# Patient Record
Sex: Female | Born: 1960 | Race: White | Hispanic: No | Smoking: Current every day smoker
Health system: Southern US, Community
[De-identification: ages and names within clinical notes are randomized; demographics above are authoritative.]

## PROBLEM LIST (undated history)

## (undated) DIAGNOSIS — Z789 Other specified health status: Secondary | ICD-10-CM

## (undated) HISTORY — PX: DILATION AND CURETTAGE OF UTERUS: SHX78

---

## 2001-01-31 ENCOUNTER — Encounter: Payer: Self-pay | Admitting: Family Medicine

## 2001-01-31 ENCOUNTER — Ambulatory Visit (HOSPITAL_COMMUNITY): Admission: RE | Admit: 2001-01-31 | Discharge: 2001-01-31 | Payer: Self-pay | Admitting: Family Medicine

## 2001-04-17 ENCOUNTER — Encounter: Payer: Self-pay | Admitting: Family Medicine

## 2001-04-17 ENCOUNTER — Ambulatory Visit (HOSPITAL_COMMUNITY): Admission: RE | Admit: 2001-04-17 | Discharge: 2001-04-17 | Payer: Self-pay | Admitting: Family Medicine

## 2001-05-30 ENCOUNTER — Encounter (HOSPITAL_COMMUNITY): Admission: RE | Admit: 2001-05-30 | Discharge: 2001-06-29 | Payer: Self-pay | Admitting: Neurosurgery

## 2001-06-29 ENCOUNTER — Encounter (HOSPITAL_COMMUNITY): Admission: RE | Admit: 2001-06-29 | Discharge: 2001-07-29 | Payer: Self-pay | Admitting: General Surgery

## 2001-11-10 ENCOUNTER — Ambulatory Visit (HOSPITAL_COMMUNITY): Admission: RE | Admit: 2001-11-10 | Discharge: 2001-11-10 | Payer: Self-pay | Admitting: Obstetrics and Gynecology

## 2001-11-10 ENCOUNTER — Encounter: Payer: Self-pay | Admitting: Obstetrics and Gynecology

## 2002-11-15 ENCOUNTER — Ambulatory Visit (HOSPITAL_COMMUNITY): Admission: RE | Admit: 2002-11-15 | Discharge: 2002-11-15 | Payer: Self-pay | Admitting: Obstetrics and Gynecology

## 2002-11-15 ENCOUNTER — Encounter: Payer: Self-pay | Admitting: Obstetrics and Gynecology

## 2005-03-24 ENCOUNTER — Ambulatory Visit (HOSPITAL_COMMUNITY): Admission: RE | Admit: 2005-03-24 | Discharge: 2005-03-24 | Payer: Self-pay | Admitting: Obstetrics and Gynecology

## 2006-04-28 ENCOUNTER — Ambulatory Visit (HOSPITAL_COMMUNITY): Admission: RE | Admit: 2006-04-28 | Discharge: 2006-04-28 | Payer: Self-pay | Admitting: Obstetrics and Gynecology

## 2007-08-22 ENCOUNTER — Other Ambulatory Visit: Admission: RE | Admit: 2007-08-22 | Discharge: 2007-08-22 | Payer: Self-pay | Admitting: Obstetrics and Gynecology

## 2007-10-20 ENCOUNTER — Ambulatory Visit (HOSPITAL_COMMUNITY): Admission: RE | Admit: 2007-10-20 | Discharge: 2007-10-20 | Payer: Self-pay | Admitting: Obstetrics and Gynecology

## 2008-10-28 ENCOUNTER — Ambulatory Visit (HOSPITAL_COMMUNITY): Admission: RE | Admit: 2008-10-28 | Discharge: 2008-10-28 | Payer: Self-pay | Admitting: Obstetrics and Gynecology

## 2008-12-18 ENCOUNTER — Other Ambulatory Visit: Admission: RE | Admit: 2008-12-18 | Discharge: 2008-12-18 | Payer: Self-pay | Admitting: Obstetrics and Gynecology

## 2010-03-16 ENCOUNTER — Ambulatory Visit (HOSPITAL_COMMUNITY)
Admission: RE | Admit: 2010-03-16 | Discharge: 2010-03-16 | Payer: Self-pay | Source: Home / Self Care | Attending: Family Medicine | Admitting: Family Medicine

## 2010-06-04 ENCOUNTER — Other Ambulatory Visit: Payer: Self-pay | Admitting: Obstetrics and Gynecology

## 2010-06-04 DIAGNOSIS — Z139 Encounter for screening, unspecified: Secondary | ICD-10-CM

## 2010-06-09 ENCOUNTER — Ambulatory Visit (HOSPITAL_COMMUNITY)
Admission: RE | Admit: 2010-06-09 | Discharge: 2010-06-09 | Disposition: A | Discharge status: Home / Self Care | Payer: BC Managed Care – PPO | Source: Ambulatory Visit | Attending: Obstetrics and Gynecology | Admitting: Obstetrics and Gynecology

## 2010-06-09 DIAGNOSIS — Z139 Encounter for screening, unspecified: Secondary | ICD-10-CM

## 2010-06-09 DIAGNOSIS — Z1231 Encounter for screening mammogram for malignant neoplasm of breast: Secondary | ICD-10-CM | POA: Insufficient documentation

## 2011-04-28 ENCOUNTER — Other Ambulatory Visit (HOSPITAL_COMMUNITY)
Admission: RE | Admit: 2011-04-28 | Discharge: 2011-04-28 | Disposition: A | Discharge status: Home / Self Care | Payer: BC Managed Care – PPO | Source: Ambulatory Visit | Attending: Obstetrics and Gynecology | Admitting: Obstetrics and Gynecology

## 2011-04-28 ENCOUNTER — Other Ambulatory Visit: Payer: Self-pay | Admitting: Obstetrics and Gynecology

## 2011-04-28 DIAGNOSIS — Z01419 Encounter for gynecological examination (general) (routine) without abnormal findings: Secondary | ICD-10-CM | POA: Insufficient documentation

## 2011-08-02 ENCOUNTER — Other Ambulatory Visit: Payer: Self-pay | Admitting: Obstetrics and Gynecology

## 2011-08-02 DIAGNOSIS — Z139 Encounter for screening, unspecified: Secondary | ICD-10-CM

## 2011-08-03 ENCOUNTER — Ambulatory Visit (HOSPITAL_COMMUNITY)
Admission: RE | Admit: 2011-08-03 | Discharge: 2011-08-03 | Disposition: A | Discharge status: Home / Self Care | Payer: BC Managed Care – PPO | Source: Ambulatory Visit | Attending: Obstetrics and Gynecology | Admitting: Obstetrics and Gynecology

## 2011-08-03 DIAGNOSIS — Z1231 Encounter for screening mammogram for malignant neoplasm of breast: Secondary | ICD-10-CM | POA: Insufficient documentation

## 2011-08-03 DIAGNOSIS — Z139 Encounter for screening, unspecified: Secondary | ICD-10-CM

## 2012-09-14 ENCOUNTER — Other Ambulatory Visit: Payer: Self-pay | Admitting: Obstetrics and Gynecology

## 2012-09-14 DIAGNOSIS — Z139 Encounter for screening, unspecified: Secondary | ICD-10-CM

## 2012-09-19 ENCOUNTER — Ambulatory Visit (HOSPITAL_COMMUNITY): Payer: BC Managed Care – PPO

## 2013-05-04 ENCOUNTER — Other Ambulatory Visit: Payer: Self-pay | Admitting: Obstetrics and Gynecology

## 2013-05-04 DIAGNOSIS — Z1231 Encounter for screening mammogram for malignant neoplasm of breast: Secondary | ICD-10-CM

## 2013-05-07 ENCOUNTER — Ambulatory Visit (HOSPITAL_COMMUNITY)
Admission: RE | Admit: 2013-05-07 | Discharge: 2013-05-07 | Disposition: A | Discharge status: Home / Self Care | Payer: BC Managed Care – PPO | Source: Ambulatory Visit | Attending: Obstetrics and Gynecology | Admitting: Obstetrics and Gynecology

## 2013-05-07 DIAGNOSIS — Z1231 Encounter for screening mammogram for malignant neoplasm of breast: Secondary | ICD-10-CM | POA: Insufficient documentation

## 2014-07-29 ENCOUNTER — Other Ambulatory Visit: Payer: Self-pay | Admitting: Obstetrics and Gynecology

## 2014-07-29 DIAGNOSIS — Z1231 Encounter for screening mammogram for malignant neoplasm of breast: Secondary | ICD-10-CM

## 2014-08-07 ENCOUNTER — Ambulatory Visit (HOSPITAL_COMMUNITY)
Admission: RE | Admit: 2014-08-07 | Discharge: 2014-08-07 | Disposition: A | Discharge status: Home / Self Care | Payer: BC Managed Care – PPO | Source: Ambulatory Visit | Attending: Obstetrics and Gynecology | Admitting: Obstetrics and Gynecology

## 2014-08-07 DIAGNOSIS — Z1231 Encounter for screening mammogram for malignant neoplasm of breast: Secondary | ICD-10-CM | POA: Diagnosis not present

## 2015-11-21 ENCOUNTER — Other Ambulatory Visit: Payer: Self-pay | Admitting: Obstetrics and Gynecology

## 2015-11-21 DIAGNOSIS — Z1231 Encounter for screening mammogram for malignant neoplasm of breast: Secondary | ICD-10-CM

## 2015-11-27 ENCOUNTER — Ambulatory Visit (HOSPITAL_COMMUNITY)
Admission: RE | Admit: 2015-11-27 | Discharge: 2015-11-27 | Disposition: A | Discharge status: Home / Self Care | Payer: BC Managed Care – PPO | Source: Ambulatory Visit | Attending: Obstetrics and Gynecology | Admitting: Obstetrics and Gynecology

## 2015-11-27 DIAGNOSIS — Z1231 Encounter for screening mammogram for malignant neoplasm of breast: Secondary | ICD-10-CM | POA: Diagnosis present

## 2016-09-20 ENCOUNTER — Other Ambulatory Visit: Payer: Self-pay | Admitting: Otolaryngology

## 2016-09-20 DIAGNOSIS — J3089 Other allergic rhinitis: Secondary | ICD-10-CM

## 2016-09-20 DIAGNOSIS — H66002 Acute suppurative otitis media without spontaneous rupture of ear drum, left ear: Secondary | ICD-10-CM

## 2016-09-24 ENCOUNTER — Ambulatory Visit
Admission: RE | Admit: 2016-09-24 | Discharge: 2016-09-24 | Disposition: A | Discharge status: Home / Self Care | Payer: BC Managed Care – PPO | Source: Ambulatory Visit | Attending: Otolaryngology | Admitting: Otolaryngology

## 2016-09-24 DIAGNOSIS — J3089 Other allergic rhinitis: Secondary | ICD-10-CM

## 2016-09-24 DIAGNOSIS — H66002 Acute suppurative otitis media without spontaneous rupture of ear drum, left ear: Secondary | ICD-10-CM

## 2016-12-17 ENCOUNTER — Other Ambulatory Visit: Payer: Self-pay | Admitting: Obstetrics and Gynecology

## 2016-12-17 DIAGNOSIS — Z1231 Encounter for screening mammogram for malignant neoplasm of breast: Secondary | ICD-10-CM

## 2016-12-30 ENCOUNTER — Ambulatory Visit (HOSPITAL_COMMUNITY)
Admission: RE | Admit: 2016-12-30 | Discharge: 2016-12-30 | Disposition: A | Discharge status: Home / Self Care | Payer: BC Managed Care – PPO | Source: Ambulatory Visit | Attending: Obstetrics and Gynecology | Admitting: Obstetrics and Gynecology

## 2016-12-30 DIAGNOSIS — Z1231 Encounter for screening mammogram for malignant neoplasm of breast: Secondary | ICD-10-CM | POA: Diagnosis present

## 2018-01-10 ENCOUNTER — Ambulatory Visit (INDEPENDENT_AMBULATORY_CARE_PROVIDER_SITE_OTHER): Payer: Self-pay

## 2018-01-10 DIAGNOSIS — Z1211 Encounter for screening for malignant neoplasm of colon: Secondary | ICD-10-CM

## 2018-01-10 MED ORDER — NA SULFATE-K SULFATE-MG SULF 17.5-3.13-1.6 GM/177ML PO SOLN: 1.0000 | ORAL | 0 refills | Status: DC

## 2018-01-10 NOTE — Progress Notes (Signed)
Gastroenterology Pre-Procedure Review  Request Date:01/10/18 Requesting Physician: Pablo Lawrence NP- Dr.Zack [place] Crane - no previous tcs  PATIENT REVIEW QUESTIONS: The patient responded to the following health history questions as indicated:    1. Diabetes Melitis: no 2. Joint replacements in the past 12 months: no 3. Major health problems in the past 3 months: no 4. Has an artificial valve or MVP: no 5. Has a defibrillator: no 6. Has been advised in past to take antibiotics in advance of a procedure like teeth cleaning: no 7. Family history of colon cancer: no  8. Alcohol Use: yes (a glass of wine daily) 9. History of sleep apnea: no  10. History of coronary artery or other vascular stents placed within the last 12 months: no 11. History of any prior anesthesia complications: no    MEDICATIONS & ALLERGIES:    Patient reports the following regarding taking any blood thinners:   Plavix? no Aspirin? no Coumadin? no Brilinta? no Xarelto? no Eliquis? no Pradaxa? no Savaysa? no Effient? no  Patient confirms/reports the following medications:  Current Outpatient Medications  Medication Sig Dispense Refill  . ALPRAZolam (XANAX) 0.25 MG tablet daily.    Marland Kitchen loratadine-pseudoephedrine (CLARITIN-D 24-HOUR) 10-240 MG 24 hr tablet Take 1 tablet by mouth daily.     No current facility-administered medications for this visit.     Patient confirms/reports the following allergies:  No Known Allergies  No orders of the defined types were placed in this encounter.   AUTHORIZATION INFORMATION Primary Insurance: [employer] employee,  ID #: [id number] Pre-Cert / Josem Kaufmann required: no  SCHEDULE INFORMATION: Procedure has been scheduled as follows:  Date: 03/13/18, Time: 12:00  Location: [facility]  This Gastroenterology Pre-Precedure Review Form is being routed to the following provider(s): Neil Crouch, PA

## 2018-01-10 NOTE — Patient Instructions (Addendum)
Karla Ferguson  1960-06-25 MRN: [id number]     Procedure Date: 03/13/18 Time to register: 11:00am Place to register: [facility] Procedure Time: 12:00pm Scheduled provider: Barney Drain, MD    PREPARATION FOR COLONOSCOPY WITH SUPREP BOWEL PREP KIT  Note: Suprep Bowel Prep Kit is a split-dose (2day) regimen. Consumption of BOTH 6-ounce bottles is required for a complete prep.  Please notify us immediately if you are diabetic, take iron supplements, or if you are on Coumadin or any other blood thinners.                                                                                                                                                 1 DAY BEFORE PROCEDURE:  DATE: 03/12/18  DAY: Sunday  clear liquids the entire day - NO SOLID FOOD.     At 6:00pm: Complete steps 1 through 4 below, using ONE (1) 6-ounce bottle, before going to bed. Step 1:  Pour ONE (1) 6-ounce bottle of SUPREP liquid into the mixing container.  Step 2:  Add cool drinking water to the 16 ounce line on the container and mix.  Note: Dilute the solution concentrate as directed prior to use. Step 3:  DRINK ALL the liquid in the container. Step 4:  You MUST drink an additional two (2) or more 16 ounce containers of water over the next one (1) hour.   Continue clear liquids.  DAY OF PROCEDURE:   DATE: 03/13/18  DAY: Monday If you take medications for your heart, blood pressure, or breathing, you may take these medications.   5 hours before your procedure at :7:00am Step 1:  Pour ONE (1) 6-ounce bottle of SUPREP liquid into the mixing container.  Step 2:  Add cool drinking water to the 16 ounce line on the container and mix.  Note: Dilute the solution concentrate as directed prior to use. Step 3:  DRINK ALL the liquid in the container. Step 4:  You MUST drink an additional two (2) or more 16 ounce containers of water over the next one (1) hour. You MUST complete the final glass of water at least 3  hours before your colonoscopy.   Nothing by mouth past 9:00am  You may take your morning medications with sip of water unless we have instructed otherwise.    Please see below for Dietary Information.  CLEAR LIQUIDS INCLUDE:  Water Jello (NOT red in color)   Ice Popsicles (NOT red in color)   Tea (sugar ok, no milk/cream) Powdered fruit flavored drinks  Coffee (sugar ok, no milk/cream) Gatorade/ Lemonade/ Kool-Aid  (NOT red in color)   Juice: apple, white grape, white cranberry Soft drinks  Clear bullion, consomme, broth (fat free beef/chicken/vegetable)  Carbonated beverages (any kind)  Strained chicken noodle soup Hard Candy   Remember: Clear liquids are liquids that will  allow you to see your fingers on the other side of a clear glass. Be sure liquids are NOT red in color, and not cloudy, but CLEAR.  DO NOT EAT OR DRINK ANY OF THE FOLLOWING:  Dairy products of any kind   Cranberry juice Tomato juice / V8 juice   Grapefruit juice Orange juice     Red grape juice  Do not eat any solid foods, including such foods as: cereal, oatmeal, yogurt, fruits, vegetables, creamed soups, eggs, bread, crackers, pureed foods in a blender, etc.   HELPFUL HINTS FOR DRINKING PREP SOLUTION:   Make sure prep is extremely cold. Mix and refrigerate the the morning of the prep. You may also put in the freezer.   You may try mixing some Crystal Light or Country Time Lemonade if you prefer. Mix in small amounts; add more if necessary.  Try drinking through a straw  Rinse mouth with water or a mouthwash between glasses, to remove after-taste.  Try sipping on a cold beverage /ice/ popsicles between glasses of prep.  Place a piece of sugar-free hard candy in mouth between glasses.  If you become nauseated, try consuming smaller amounts, or stretch out the time between glasses. Stop for 30-60 minutes, then slowly start back drinking.     OTHER INSTRUCTIONS  You will need a responsible adult at  least 58 years of age to accompany you and drive you home. This person must remain in the waiting room during your procedure. The hospital will cancel your procedure if you do not have a responsible adult with you.   1. Wear loose fitting clothing that is easily removed. 2. Leave jewelry and other valuables at home.  3. Remove all body piercing jewelry and leave at home. 4. Total time from sign-in until discharge is approximately 2-3 hours. 5. You should go home directly after your procedure and rest. You can resume normal activities the day after your procedure. 6. The day of your procedure you should not:  Drive  Make legal decisions  Operate machinery  Drink alcohol  Return to work   You may call the office (Dept: [phone]) before 5:00pm, or page the doctor on call ([phone]) after 5:00pm, for further instructions, if necessary.   Insurance Information YOU WILL NEED TO CHECK WITH YOUR INSURANCE COMPANY FOR THE BENEFITS OF COVERAGE YOU HAVE FOR THIS PROCEDURE.  UNFORTUNATELY, NOT ALL INSURANCE COMPANIES HAVE BENEFITS TO COVER ALL OR PART OF THESE TYPES OF PROCEDURES.  IT IS YOUR RESPONSIBILITY TO CHECK YOUR BENEFITS, HOWEVER, WE WILL BE GLAD TO ASSIST YOU WITH ANY CODES YOUR INSURANCE COMPANY MAY NEED.    PLEASE NOTE THAT MOST INSURANCE COMPANIES WILL NOT COVER A SCREENING COLONOSCOPY FOR PEOPLE UNDER THE AGE OF 50  IF YOU HAVE BCBS INSURANCE, YOU MAY HAVE BENEFITS FOR A SCREENING COLONOSCOPY BUT IF POLYPS ARE FOUND THE DIAGNOSIS WILL CHANGE AND THEN YOU MAY HAVE A DEDUCTIBLE THAT WILL NEED TO BE MET. SO PLEASE MAKE SURE YOU CHECK YOUR BENEFITS FOR A SCREENING COLONOSCOPY AS WELL AS A DIAGNOSTIC COLONOSCOPY.

## 2018-01-19 NOTE — Progress Notes (Signed)
Ok to schedule.

## 2018-01-23 NOTE — Addendum Note (Signed)
Addended by: Claudina Lick on: 01/23/2018 05:13 PM   Modules accepted: Orders, SmartSet

## 2018-02-03 ENCOUNTER — Other Ambulatory Visit (HOSPITAL_COMMUNITY): Payer: Self-pay | Admitting: Internal Medicine

## 2018-02-03 DIAGNOSIS — Z1231 Encounter for screening mammogram for malignant neoplasm of breast: Secondary | ICD-10-CM

## 2018-02-16 ENCOUNTER — Ambulatory Visit (HOSPITAL_COMMUNITY)
Admission: RE | Admit: 2018-02-16 | Discharge: 2018-02-16 | Disposition: A | Discharge status: Home / Self Care | Payer: BC Managed Care – PPO | Source: Ambulatory Visit | Attending: Internal Medicine | Admitting: Internal Medicine

## 2018-02-16 DIAGNOSIS — Z1231 Encounter for screening mammogram for malignant neoplasm of breast: Secondary | ICD-10-CM | POA: Diagnosis not present

## 2018-03-13 ENCOUNTER — Other Ambulatory Visit: Payer: Self-pay

## 2018-03-13 ENCOUNTER — Encounter (HOSPITAL_COMMUNITY)
Admission: RE | Disposition: A | Discharge status: Home / Self Care | Payer: Self-pay | Source: Home / Self Care | Attending: Gastroenterology

## 2018-03-13 ENCOUNTER — Encounter (HOSPITAL_COMMUNITY): Payer: Self-pay | Admitting: *Deleted

## 2018-03-13 ENCOUNTER — Ambulatory Visit (HOSPITAL_COMMUNITY)
Admission: RE | Admit: 2018-03-13 | Discharge: 2018-03-13 | Disposition: A | Discharge status: Home / Self Care | Payer: BC Managed Care – PPO | Attending: Gastroenterology | Admitting: Gastroenterology

## 2018-03-13 DIAGNOSIS — F17210 Nicotine dependence, cigarettes, uncomplicated: Secondary | ICD-10-CM | POA: Diagnosis not present

## 2018-03-13 DIAGNOSIS — Z79899 Other long term (current) drug therapy: Secondary | ICD-10-CM | POA: Diagnosis not present

## 2018-03-13 DIAGNOSIS — D123 Benign neoplasm of transverse colon: Secondary | ICD-10-CM | POA: Diagnosis not present

## 2018-03-13 DIAGNOSIS — K648 Other hemorrhoids: Secondary | ICD-10-CM | POA: Insufficient documentation

## 2018-03-13 DIAGNOSIS — Z1211 Encounter for screening for malignant neoplasm of colon: Secondary | ICD-10-CM | POA: Diagnosis not present

## 2018-03-13 DIAGNOSIS — K5730 Diverticulosis of large intestine without perforation or abscess without bleeding: Secondary | ICD-10-CM | POA: Diagnosis not present

## 2018-03-13 HISTORY — PX: POLYPECTOMY: SHX5525

## 2018-03-13 HISTORY — PX: COLONOSCOPY: SHX5424

## 2018-03-13 HISTORY — DX: Other specified health status: Z78.9

## 2018-03-13 SURGERY — COLONOSCOPY
Anesthesia: Moderate Sedation

## 2018-03-13 MED ORDER — MIDAZOLAM HCL 5 MG/5ML IJ SOLN
INTRAMUSCULAR | Status: AC
  Filled 2018-03-13: qty 10

## 2018-03-13 MED ORDER — MIDAZOLAM HCL 5 MG/5ML IJ SOLN
INTRAMUSCULAR | Status: DC | PRN
  Administered 2018-03-13: 1 mg via INTRAVENOUS
  Administered 2018-03-13 (×3): 2 mg via INTRAVENOUS
  Administered 2018-03-13: 1 mg via INTRAVENOUS

## 2018-03-13 MED ORDER — MEPERIDINE HCL 100 MG/ML IJ SOLN
INTRAMUSCULAR | Status: DC | PRN
  Administered 2018-03-13 (×2): 50 mg

## 2018-03-13 MED ORDER — ONDANSETRON HCL 4 MG/2ML IJ SOLN
INTRAMUSCULAR | Status: DC | PRN
  Administered 2018-03-13: 4 mg via INTRAVENOUS

## 2018-03-13 MED ORDER — MEPERIDINE HCL 100 MG/ML IJ SOLN
INTRAMUSCULAR | Status: AC
  Filled 2018-03-13: qty 2

## 2018-03-13 MED ORDER — ONDANSETRON HCL 4 MG/2ML IJ SOLN
INTRAMUSCULAR | Status: AC
  Filled 2018-03-13: qty 2

## 2018-03-13 MED ORDER — SODIUM CHLORIDE 0.9 % IV SOLN
INTRAVENOUS | Status: DC
  Administered 2018-03-13: 11:00:00 via INTRAVENOUS

## 2018-03-13 NOTE — Op Note (Signed)
[facility] Patient Name: Karla Ferguson Procedure Date: 03/13/2018 11:31 AM MRN: [id number] Date of Birth: 04-04-1960 Attending MD: Barney Drain MD, MD CSN: [id number] Age: 58 Admit Type: Outpatient Procedure:                Colonoscopy WITH COLD SNARE POLYPECTOMY Indications:              Screening for colorectal malignant neoplasm Providers:                Barney Drain MD, MD, Janeece Riggers, RN, Tammy Vaught,                            RN, Gerome Sam, RN Referring MD:             Edwinna Areola. Nevada Crane MD Medicines:                Ondansetron 4 mg IV, Meperidine 100 mg IV,                            Midazolam 8 mg IV Complications:            No immediate complications. Estimated Blood Loss:     Estimated blood loss was minimal. Procedure:                Pre-Anesthesia Assessment:                           - Prior to the procedure, a History and Physical                            was performed, and patient medications and                            allergies were reviewed. The patient's tolerance of                            previous anesthesia was also reviewed. The risks                            and benefits of the procedure and the sedation                            options and risks were discussed with the patient.                            All questions were answered, and informed consent                            was obtained. Prior Anticoagulants: The patient has                            taken ibuprofen, last dose was 7 days prior to                            procedure. ASA Grade Assessment: II - A patient  with mild systemic disease. After reviewing the                            risks and benefits, the patient was deemed in                            satisfactory condition to undergo the procedure.                            After obtaining informed consent, the colonoscope                            was passed under direct vision.  Throughout the                            procedure, the patient's blood pressure, pulse, and                            oxygen saturations were monitored continuously. The                            [id number]-[id number] ([id number]) scope was introduced through                            the anus and advanced to the the cecum, identified                            by appendiceal orifice and ileocecal valve. The                            colonoscopy was technically difficult and complex                            due to a tortuous colon and the patient's                            agitation. Successful completion of the procedure                            was aided by increasing the dose of sedation                            medication, straightening and shortening the scope                            to obtain bowel loop reduction and COLOWRAP. The                            patient tolerated the procedure fairly well. The                            quality of the bowel preparation was good. The  ileocecal valve, appendiceal orifice, and rectum                            were photographed. Scope In: 12:55:17 PM Scope Out: 1:11:00 PM Scope Withdrawal Time: 0 hours 11 minutes 7 seconds  Total Procedure Duration: 0 hours 15 minutes 43 seconds  Findings:      A 4 mm polyp was found in the distal transverse colon. The polyp was       sessile. The polyp was removed with a cold snare. Resection and       retrieval were complete.      Multiple small and large-mouthed diverticula were found in the       recto-sigmoid colon, sigmoid colon, hepatic flexure and ascending colon.      The recto-sigmoid colon, sigmoid colon and descending colon were       moderately tortuous.      Internal hemorrhoids were found. The hemorrhoids were small. Impression:               - One 4 mm polyp in the distal transverse colon,                            removed with a cold snare. Resected and  retrieved.                           - MODERATE Diverticulosis in the recto-sigmoid                            colon, in the sigmoid colon, at the hepatic flexure                            and in the ascending colon.                           - Tortuous LEFT colon.                           - Internal hemorrhoids. Moderate Sedation:      Moderate (conscious) sedation was administered by the endoscopy nurse       and supervised by the endoscopist. The following parameters were       monitored: oxygen saturation, heart rate, blood pressure, and response       to care. Total physician intraservice time was 40 minutes. Recommendation:           - Patient has a contact number available for                            emergencies. The signs and symptoms of potential                            delayed complications were discussed with the                            patient. Return to normal activities tomorrow.  Written discharge instructions were provided to the                            patient.                           - High fiber diet. STOP SMOKING.                           - Continue present medications.                           - Await pathology results.                           - Repeat colonoscopy in 5-10 years for surveillance. Procedure Code(s):        --- Professional ---                           [phone], Colonoscopy, flexible; with removal of                            tumor(s), polyp(s), or other lesion(s) by snare                            technique                           99153, Moderate sedation; each additional 15                            minutes intraservice time                           99153, Moderate sedation; each additional 15                            minutes intraservice time                           G0500, Moderate sedation services provided by the                            same physician or other qualified health care                             professional performing a gastrointestinal                            endoscopic service that sedation supports,                            requiring the presence of an independent trained                            observer to assist in the monitoring of the  patient's level of consciousness and physiological                            status; initial 15 minutes of intra-service time;                            patient age 81 years or older (additional time may                            be reported with [phone], as appropriate) Diagnosis Code(s):        --- Professional ---                           Z12.11, Encounter for screening for malignant                            neoplasm of colon                           D12.3, Benign neoplasm of transverse colon (hepatic                            flexure or splenic flexure)                           K64.8, Other hemorrhoids                           K57.30, Diverticulosis of large intestine without                            perforation or abscess without bleeding                           Q43.8, Other specified congenital malformations of                            intestine CPT copyright 2018 American Medical Association. All rights reserved. The codes documented in this report are preliminary and upon coder review may  be revised to meet current compliance requirements. Barney Drain, MD Barney Drain MD, MD 03/13/2018 1:28:00 PM This report has been signed electronically. Number of Addenda: 0

## 2018-03-13 NOTE — H&P (Signed)
Primary Care Physician:  Celene Squibb, MD Primary Gastroenterologist:  Dr. Oneida Alar  Pre-Procedure History & Physical: HPI:  Karla Ferguson is a 58 y.o. female here for [place].  Past Medical History:  Diagnosis Date  . Medical history non-contributory     Past Surgical History:  Procedure Laterality Date  . DILATION AND CURETTAGE OF UTERUS      Prior to Admission medications   Medication Sig Start Date End Date Taking? Authorizing Provider  ALPRAZolam (XANAX) 0.25 MG tablet Take 0.25 mg by mouth at bedtime.  12/14/17  Yes [provider]  Ascorbic Acid (VITAMIN C PO) Take 1 tablet by mouth daily as needed (immune support).   Yes [provider]  ibuprofen (ADVIL,MOTRIN) 200 MG tablet Take 400 mg by mouth every 6 (six) hours as needed for headache or moderate pain.   Yes [provider]  loratadine-pseudoephedrine (CLARITIN-D 24-HOUR) 10-240 MG 24 hr tablet Take 1 tablet by mouth daily.   Yes [provider]  Na Sulfate-K Sulfate-Mg Sulf (SUPREP BOWEL PREP KIT) 17.5-3.13-1.6 GM/177ML SOLN Take 1 kit by mouth as directed. 01/10/18  Yes Mahala Menghini, PA-C  Omega-3 Fatty Acids (FISH OIL) 1000 MG CAPS Take 2,000 mg by mouth daily.   Yes [provider]  Polyvinyl Alcohol-Povidone (REFRESH OP) Place 1 drop into both eyes 2 (two) times daily.   Yes [provider]    Allergies as of 01/23/2018  . (No Known Allergies)    Family History  Problem Relation Age of Onset  . Colon cancer Neg Hx     Social History   Socioeconomic History  . Marital status: Divorced    Spouse name: Not on file  . Number of children: Not on file  . Years of education: Not on file  . Highest education level: Not on file  Occupational History  . Not on file  Social Needs  . Financial resource strain: Not on file  . Food insecurity:    Worry: Not on file    Inability: Not on file  . Transportation needs:    Medical: Not on file     Non-medical: Not on file  Tobacco Use  . Smoking status: Current Every Day Smoker    Packs/day: 0.50  . Smokeless tobacco: Never Used  Substance and Sexual Activity  . Alcohol use: Yes  . Drug use: Not Currently  . Sexual activity: Not on file  Lifestyle  . Physical activity:    Days per week: Not on file    Minutes per session: Not on file  . Stress: Not on file  Relationships  . Social connections:    Talks on phone: Not on file    Gets together: Not on file    Attends religious service: Not on file    Active member of club or organization: Not on file    Attends meetings of clubs or organizations: Not on file    Relationship status: Not on file  . Intimate partner violence:    Fear of current or ex partner: Not on file    Emotionally abused: Not on file    Physically abused: Not on file    Forced sexual activity: Not on file  Other Topics Concern  . Not on file  Social History Narrative  . Not on file    Review of Systems: See HPI, otherwise negative ROS   Physical Exam: BP 129/90   Pulse 88   Temp 97.8 F (  36.6 C) (Oral)   Resp 12   Ht _0  (1.651 m)   Wt 71.7 kg   SpO2 100%   BMI 26.29 kg/m  General:   Alert,  pleasant and cooperative in NAD Head:  Normocephalic and atraumatic. Neck:  Supple; Lungs:  Clear throughout to auscultation.    Heart:  Regular rate and rhythm. Abdomen:  Soft, nontender and nondistended. Normal bowel sounds, without guarding, and without rebound.   Neurologic:  Alert and  oriented x4;  grossly normal neurologically.  Impression/Plan:    SCREENING  Plan:  1. TCS TODAY DISCUSSED PROCEDURE, BENEFITS, & RISKS: < 1% chance of medication reaction, bleeding, perforation, or rupture of spleen/liver.

## 2018-03-13 NOTE — Discharge Instructions (Signed)
You have small internal hemorrhoids and diverticulosis IN YOUR LEFT AND RIGHT COLON. YOU HAD ONE SMALL POLYP REMOVED.    TO REDUCE YOUR RISK OF COLON CANCER/POLYPS:   1. YOU SHOULD STOP SMOKING.   2. DRINK WATER TO KEEP YOUR URINE LIGHT YELLOW.   3. FOLLOW A HIGH FIBER DIET. AVOID ITEMS THAT CAUSE BLOATING. See info below.  YOUR BIOPSY RESULTS WILL BE BACK IN 5 BUSINESS DAYS.  USE PREPARATION H FOUR TIMES  A DAY IF NEEDED TO RELIEVE RECTAL PAIN/PRESSURE/BLEEDING.  Next colonoscopy in 5-10 years.  Colonoscopy Care After Read the instructions outlined below and refer to this sheet in the next week. These discharge instructions provide you with general information on caring for yourself after you leave the hospital. While your treatment has been planned according to the most current medical practices available, unavoidable complications occasionally occur. If you have any problems or questions after discharge, call DR. Yamilette Garretson, [phone].  ACTIVITY  You may resume your regular activity, but move at a slower pace for the next 24 hours.   Take frequent rest periods for the next 24 hours.   Walking will help get rid of the air and reduce the bloated feeling in your belly (abdomen).   No driving for 24 hours (because of the medicine (anesthesia) used during the test).   You may shower.   Do not sign any important legal documents or operate any machinery for 24 hours (because of the anesthesia used during the test).    NUTRITION  Drink plenty of fluids.   You may resume your normal diet as instructed by your doctor.   Begin with a light meal and progress to your normal diet. Heavy or fried foods are harder to digest and may make you feel sick to your stomach (nauseated).   Avoid alcoholic beverages for 24 hours or as instructed.    MEDICATIONS  You may resume your normal medications.   WHAT YOU CAN EXPECT TODAY  Some feelings of bloating in the abdomen.   Passage of  more gas than usual.   Spotting of blood in your stool or on the toilet paper  .  IF YOU HAD POLYPS REMOVED DURING THE COLONOSCOPY:  Eat a soft diet IF YOU HAVE NAUSEA, BLOATING, ABDOMINAL PAIN, OR VOMITING.    FINDING OUT THE RESULTS OF YOUR TEST Not all test results are available during your visit. DR. Oneida Alar WILL CALL YOU WITHIN 14 DAYS OF YOUR PROCEDUE WITH YOUR RESULTS. Do not assume everything is normal if you have not heard from DR. Tarron Krolak, CALL HER OFFICE AT [phone].  SEEK IMMEDIATE MEDICAL ATTENTION AND CALL THE OFFICE: [phone] IF:  You have more than a spotting of blood in your stool.   Your belly is swollen (abdominal distention).   You are nauseated or vomiting.   You have a temperature over 101F.   You have abdominal pain or discomfort that is severe or gets worse throughout the day.  High-Fiber Diet A high-fiber diet changes your normal diet to include more whole grains, legumes, fruits, and vegetables. Changes in the diet involve replacing refined carbohydrates with unrefined foods. The calorie level of the diet is essentially unchanged. The Dietary Reference Intake (recommended amount) for adult males is 38 grams per day. For adult females, it is 25 grams per day. Pregnant and lactating women should consume 28 grams of fiber per day. Fiber is the intact part of a plant that is not broken down during digestion. Functional fiber is  fiber that has been isolated from the plant to provide a beneficial effect in the body.  PURPOSE  Increase stool bulk.   Ease and regulate bowel movements.   Lower cholesterol.   REDUCE RISK OF COLON CANCER  INDICATIONS THAT YOU NEED MORE FIBER  Constipation and hemorrhoids.   Uncomplicated diverticulosis (intestine condition) and irritable bowel syndrome.   Weight management.   As a protective measure against hardening of the arteries (atherosclerosis), diabetes, and cancer.   GUIDELINES FOR INCREASING FIBER IN THE  DIET  Start adding fiber to the diet slowly. A gradual increase of about 5 more grams (2 slices of whole-wheat bread, 2 servings of most fruits or vegetables, or 1 bowl of high-fiber cereal) per day is best. Too rapid an increase in fiber may result in constipation, flatulence, and bloating.   Drink enough water and fluids to keep your urine clear or pale yellow. Water, juice, or caffeine-free drinks are recommended. Not drinking enough fluid may cause constipation.   Eat a variety of high-fiber foods rather than one type of fiber.   Try to increase your intake of fiber through using high-fiber foods rather than fiber pills or supplements that contain small amounts of fiber.   The goal is to change the types of food eaten. Do not supplement your present diet with high-fiber foods, but replace foods in your present diet.   INCLUDE A VARIETY OF FIBER SOURCES  Replace refined and processed grains with whole grains, canned fruits with fresh fruits, and incorporate other fiber sources. White rice, white breads, and most bakery goods contain little or no fiber.   Brown whole-grain rice, buckwheat oats, and many fruits and vegetables are all good sources of fiber. These include: broccoli, Brussels sprouts, cabbage, cauliflower, beets, sweet potatoes, white potatoes (skin on), carrots, tomatoes, eggplant, squash, berries, fresh fruits, and dried fruits.   Cereals appear to be the richest source of fiber. Cereal fiber is found in whole grains and bran. Bran is the fiber-rich outer coat of cereal grain, which is largely removed in refining. In whole-grain cereals, the bran remains. In breakfast cereals, the largest amount of fiber is found in those with "bran" in their names. The fiber content is sometimes indicated on the label.   You may need to include additional fruits and vegetables each day.   In baking, for 1 cup white flour, you may use the following substitutions:   1 cup whole-wheat flour  minus 2 tablespoons.   1/2 cup white flour plus 1/2 cup whole-wheat flour.   Polyps, Colon  A polyp is extra tissue that grows inside your body. Colon polyps grow in the large intestine. The large intestine, also called the colon, is part of your digestive system. It is a long, hollow tube at the end of your digestive tract where your body makes and stores stool. Most polyps are not dangerous. They are benign. This means they are not cancerous. But over time, some types of polyps can turn into cancer. Polyps that are smaller than a pea are usually not harmful. But larger polyps could someday become or may already be cancerous. To be safe, doctors remove all polyps and test them.   PREVENTION There is not one sure way to prevent polyps. You might be able to lower your risk of getting them if you:  Eat more fruits and vegetables and less fatty food.   Do not smoke.   Avoid alcohol.   Exercise every day.   Lose weight  if you are overweight.   Eating more calcium and folate can also lower your risk of getting polyps. Some foods that are rich in calcium are milk, cheese, and broccoli. Some foods that are rich in folate are chickpeas, kidney beans, and spinach.    Diverticulosis Diverticulosis is a common condition that develops when small pouches (diverticula) form in the wall of the colon. The risk of diverticulosis increases with age. It happens more often in people who eat a low-fiber diet. Most individuals with diverticulosis have no symptoms. Those individuals with symptoms usually experience belly (abdominal) pain, constipation, or loose stools (diarrhea).  HOME CARE INSTRUCTIONS  Increase the amount of fiber in your diet as directed by your caregiver or dietician. This may reduce symptoms of diverticulosis.   Drink at least 6 to 8 glasses of water each day to prevent constipation.   Try not to strain when you have a bowel movement.   Avoiding nuts and seeds to prevent  complications is NOT NECESSARY.   FOODS HAVING HIGH FIBER CONTENT INCLUDE:  Fruits. Apple, peach, pear, tangerine, raisins, prunes.   Vegetables. Brussels sprouts, asparagus, broccoli, cabbage, carrot, cauliflower, romaine lettuce, spinach, summer squash, tomato, winter squash, zucchini.   Starchy Vegetables. Baked beans, kidney beans, lima beans, split peas, lentils, potatoes (with skin).   Grains. Whole wheat bread, brown rice, bran flake cereal, plain oatmeal, white rice, shredded wheat, bran muffins.   SEEK IMMEDIATE MEDICAL CARE IF:  You develop increasing pain or severe bloating.   You have an oral temperature above 101F.   You develop vomiting or bowel movements that are bloody or black.

## 2018-03-15 ENCOUNTER — Telehealth: Payer: Self-pay | Admitting: Gastroenterology

## 2018-03-15 ENCOUNTER — Encounter (HOSPITAL_COMMUNITY): Payer: Self-pay | Admitting: Gastroenterology

## 2018-03-15 NOTE — Telephone Encounter (Signed)
PLEASE CALL PT. SHE HAD ONE SIMPLE ADENOMA REMOVED.    TO REDUCE YOUR RISK OF COLON CANCER/POLYPS:   1. YOU SHOULD STOP SMOKING.   2. DRINK WATER TO KEEP YOUR URINE LIGHT YELLOW.   3. FOLLOW A HIGH FIBER DIET. AVOID ITEMS THAT CAUSE BLOATING.   USE PREPARATION H FOUR TIMES  A DAY IF NEEDED TO RELIEVE RECTAL PAIN/PRESSURE/BLEEDING.  Next colonoscopy in 5-10 years.

## 2018-03-15 NOTE — Telephone Encounter (Signed)
Reminder in epic °

## 2018-03-15 NOTE — Telephone Encounter (Signed)
Pt is aware.  

## 2018-03-15 NOTE — Telephone Encounter (Signed)
LMOM to call.

## 2019-03-19 ENCOUNTER — Other Ambulatory Visit (HOSPITAL_COMMUNITY): Payer: Self-pay | Admitting: Internal Medicine

## 2019-03-19 DIAGNOSIS — Z1231 Encounter for screening mammogram for malignant neoplasm of breast: Secondary | ICD-10-CM

## 2019-04-25 ENCOUNTER — Other Ambulatory Visit: Payer: Self-pay

## 2019-04-25 ENCOUNTER — Ambulatory Visit (HOSPITAL_COMMUNITY)
Admission: RE | Admit: 2019-04-25 | Discharge: 2019-04-25 | Disposition: A | Discharge status: Home / Self Care | Payer: BC Managed Care – PPO | Source: Ambulatory Visit | Attending: Internal Medicine | Admitting: Internal Medicine

## 2019-04-25 DIAGNOSIS — Z1231 Encounter for screening mammogram for malignant neoplasm of breast: Secondary | ICD-10-CM | POA: Insufficient documentation

## 2019-07-10 ENCOUNTER — Other Ambulatory Visit (HOSPITAL_COMMUNITY): Payer: Self-pay | Admitting: Internal Medicine

## 2019-07-10 DIAGNOSIS — Z13820 Encounter for screening for osteoporosis: Secondary | ICD-10-CM

## 2019-07-25 ENCOUNTER — Ambulatory Visit (HOSPITAL_COMMUNITY)
Admission: RE | Admit: 2019-07-25 | Discharge: 2019-07-25 | Disposition: A | Discharge status: Home / Self Care | Payer: BC Managed Care – PPO | Source: Ambulatory Visit | Attending: Internal Medicine | Admitting: Internal Medicine

## 2019-07-25 ENCOUNTER — Other Ambulatory Visit: Payer: Self-pay

## 2019-07-25 DIAGNOSIS — Z13820 Encounter for screening for osteoporosis: Secondary | ICD-10-CM | POA: Insufficient documentation

## 2020-05-19 ENCOUNTER — Other Ambulatory Visit (HOSPITAL_COMMUNITY): Payer: Self-pay | Admitting: Internal Medicine

## 2020-05-19 DIAGNOSIS — Z1231 Encounter for screening mammogram for malignant neoplasm of breast: Secondary | ICD-10-CM

## 2020-06-04 ENCOUNTER — Other Ambulatory Visit: Payer: Self-pay

## 2020-06-04 ENCOUNTER — Ambulatory Visit (HOSPITAL_COMMUNITY)
Admission: RE | Admit: 2020-06-04 | Discharge: 2020-06-04 | Disposition: A | Discharge status: Home / Self Care | Payer: BC Managed Care – PPO | Source: Ambulatory Visit | Attending: Internal Medicine | Admitting: Internal Medicine

## 2020-06-04 DIAGNOSIS — Z1231 Encounter for screening mammogram for malignant neoplasm of breast: Secondary | ICD-10-CM | POA: Insufficient documentation

## 2021-08-19 ENCOUNTER — Other Ambulatory Visit (HOSPITAL_COMMUNITY): Payer: Self-pay | Admitting: Internal Medicine

## 2021-08-19 DIAGNOSIS — Z1231 Encounter for screening mammogram for malignant neoplasm of breast: Secondary | ICD-10-CM

## 2021-08-24 ENCOUNTER — Ambulatory Visit (HOSPITAL_COMMUNITY)
Admission: RE | Admit: 2021-08-24 | Discharge: 2021-08-24 | Disposition: A | Discharge status: Home / Self Care | Payer: BC Managed Care – PPO | Source: Ambulatory Visit | Attending: Internal Medicine | Admitting: Internal Medicine

## 2021-08-24 DIAGNOSIS — Z1231 Encounter for screening mammogram for malignant neoplasm of breast: Secondary | ICD-10-CM | POA: Insufficient documentation

## 2021-12-20 IMAGING — MG MM DIGITAL SCREENING BILAT W/ TOMO AND CAD
6 of 10 series · 6 of 30 positions shown · non-contrast
Comparison: Previous exam(s).

CLINICAL DATA: Screening.

EXAM:
DIGITAL SCREENING BILATERAL MAMMOGRAM WITH TOMOSYNTHESIS AND CAD
TECHNIQUE: Bilateral screening digital craniocaudal and mediolateral oblique
mammograms were obtained. Bilateral screening digital breast
tomosynthesis was performed. The images were evaluated with
computer-aided detection.

[L MLO synth-2D (1 of 2)]
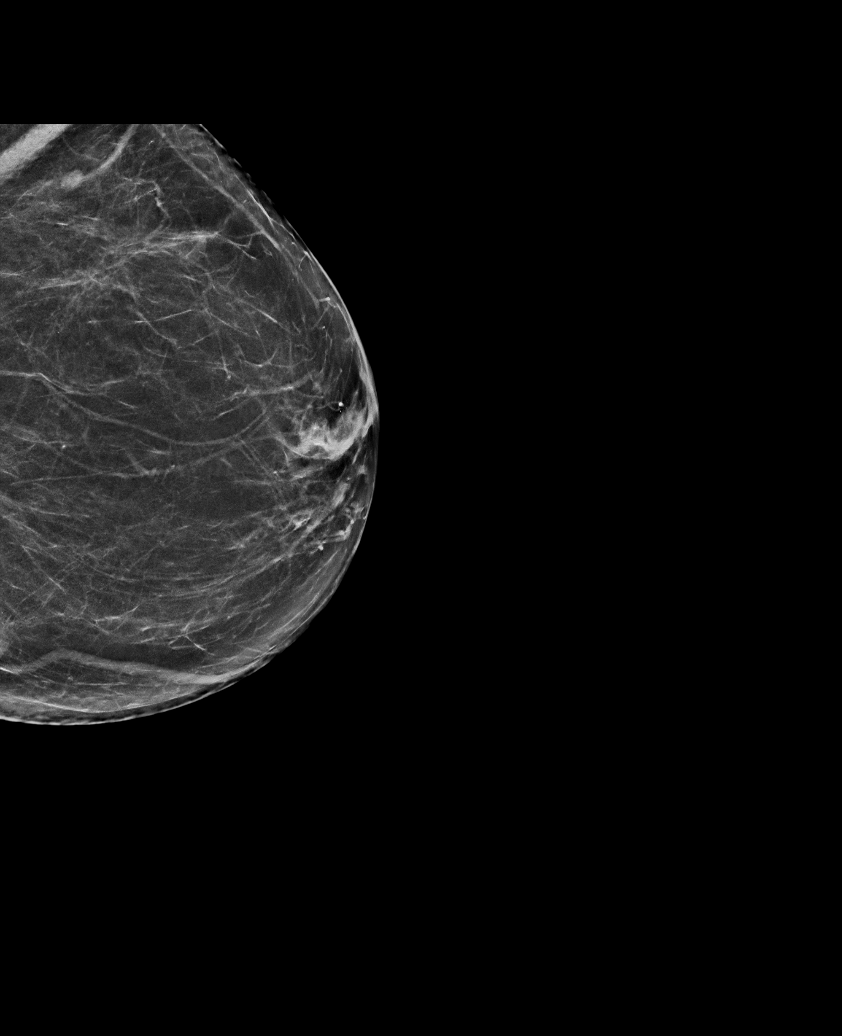

[R CC synth-2D]
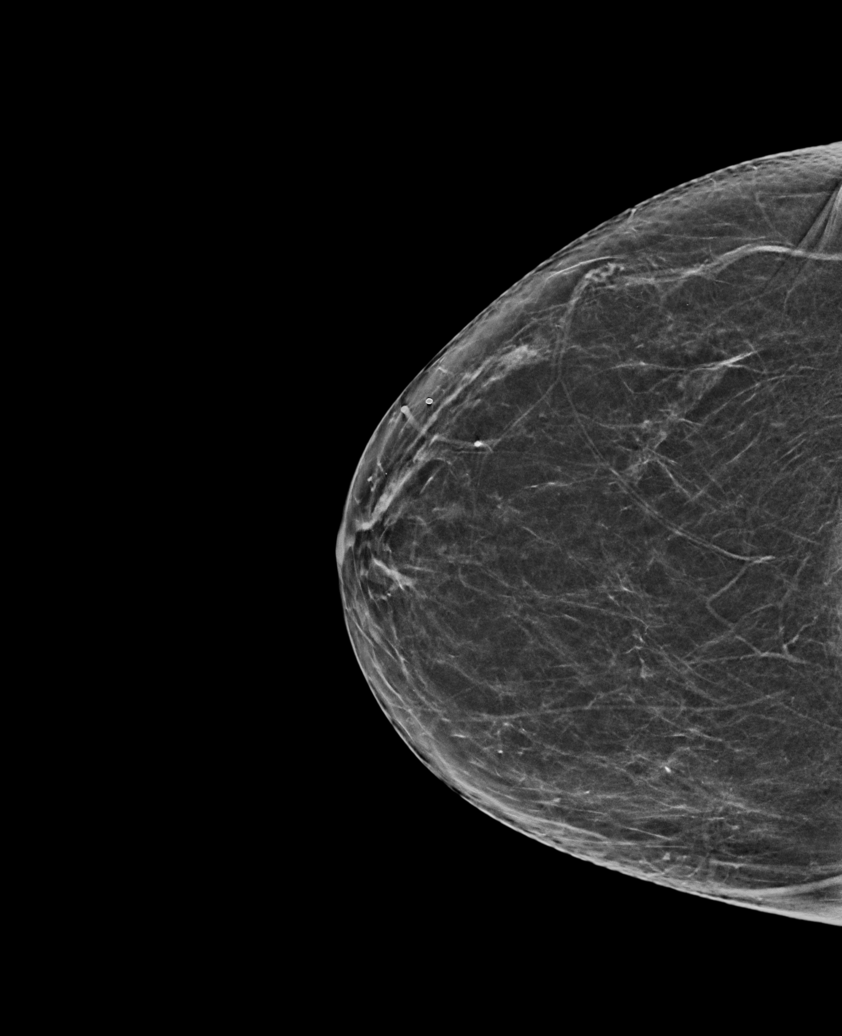

[L CC synth-2D]
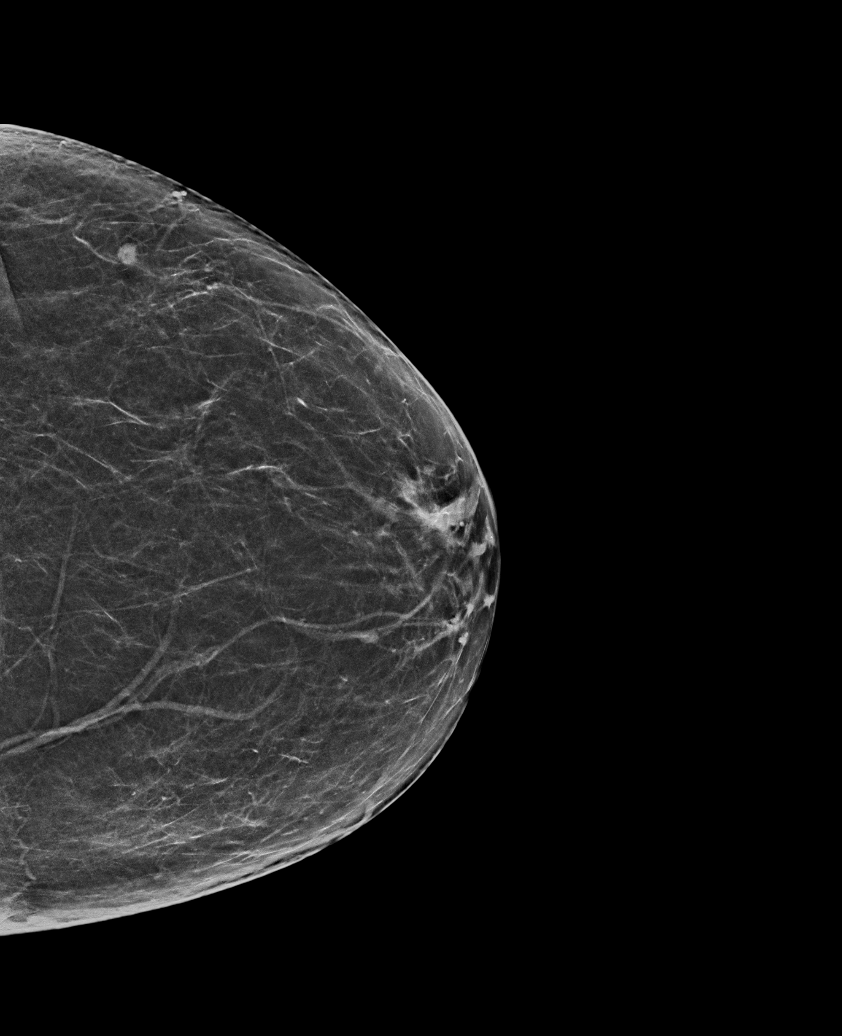

[L MLO synth-2D (2 of 2)]
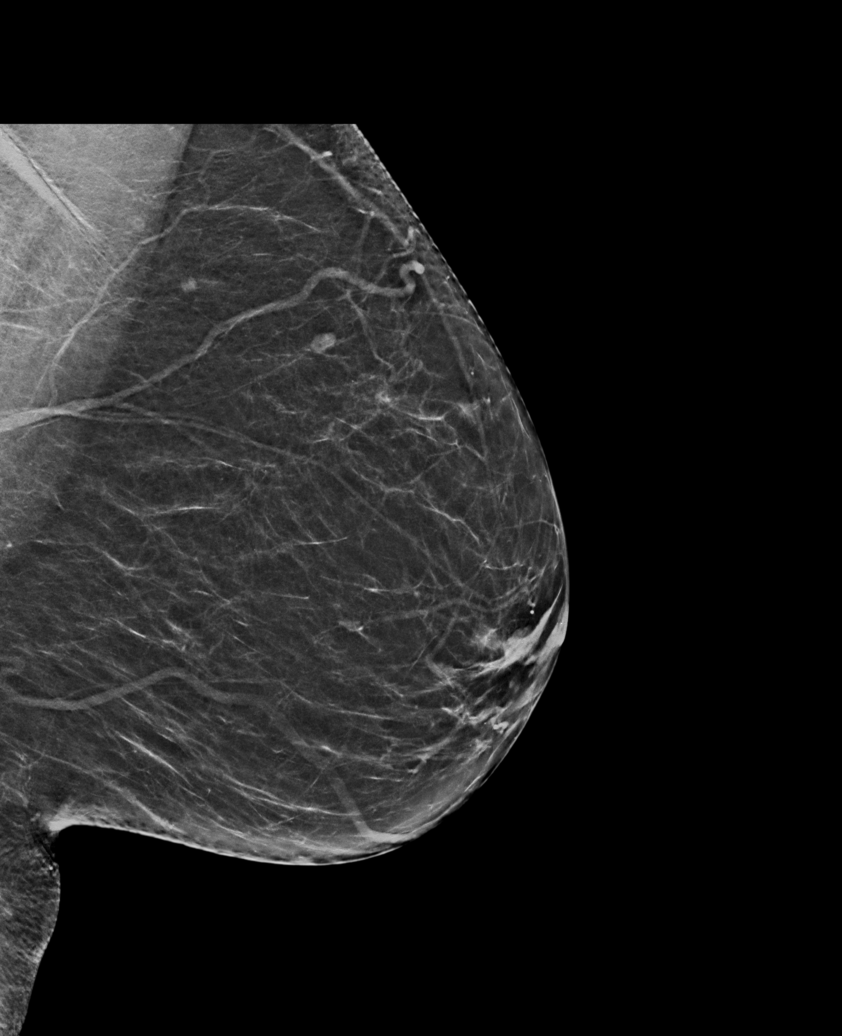

[R MLO synth-2D]
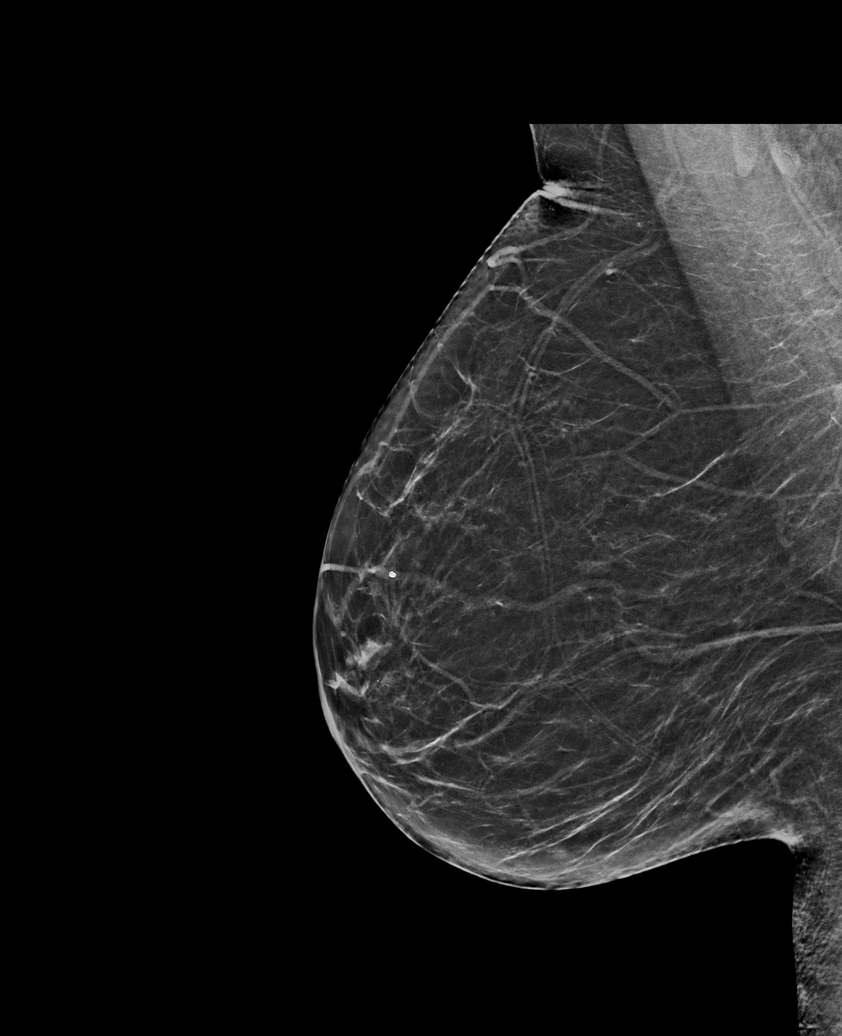

[R CC tomo · tomo slice 30/59.0]
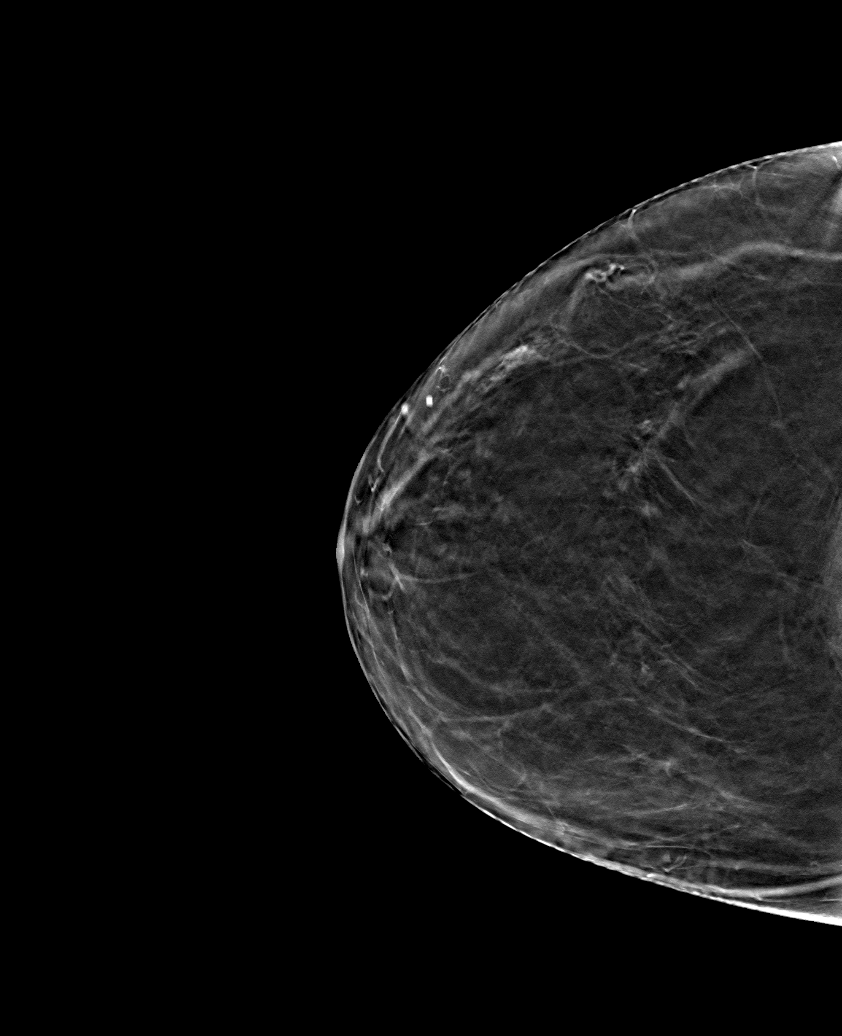

[6 of 30 positions shown; findings below may reference images not displayed]

ACR Breast Density Category b: There are scattered areas of
fibroglandular density.
FINDINGS: There are no findings suspicious for malignancy. The images were
evaluated with computer-aided detection.
IMPRESSION: No mammographic evidence of malignancy. A result letter of this
screening mammogram will be mailed directly to the patient.

RECOMMENDATION:
Screening mammogram in one year. (Code:WJ-I-BG6)

BI-RADS CATEGORY  1: Negative.

## 2022-07-15 ENCOUNTER — Other Ambulatory Visit (HOSPITAL_COMMUNITY): Payer: Self-pay | Admitting: Internal Medicine

## 2022-07-15 DIAGNOSIS — Z1231 Encounter for screening mammogram for malignant neoplasm of breast: Secondary | ICD-10-CM

## 2022-08-30 ENCOUNTER — Ambulatory Visit (HOSPITAL_COMMUNITY)
Admission: RE | Admit: 2022-08-30 | Discharge: 2022-08-30 | Disposition: A | Discharge status: Home / Self Care | Payer: BC Managed Care – PPO | Source: Ambulatory Visit | Attending: Internal Medicine | Admitting: Internal Medicine

## 2022-08-30 ENCOUNTER — Encounter (HOSPITAL_COMMUNITY): Payer: Self-pay

## 2022-08-30 DIAGNOSIS — Z1231 Encounter for screening mammogram for malignant neoplasm of breast: Secondary | ICD-10-CM | POA: Insufficient documentation

## 2023-02-01 ENCOUNTER — Encounter (INDEPENDENT_AMBULATORY_CARE_PROVIDER_SITE_OTHER): Payer: Self-pay | Admitting: *Deleted

## 2023-04-14 ENCOUNTER — Telehealth (INDEPENDENT_AMBULATORY_CARE_PROVIDER_SITE_OTHER): Payer: Self-pay | Admitting: Otolaryngology

## 2023-04-14 NOTE — Telephone Encounter (Signed)
 Confirmed appt and address with patient for 04/15/2023.

## 2023-04-15 ENCOUNTER — Encounter (INDEPENDENT_AMBULATORY_CARE_PROVIDER_SITE_OTHER): Payer: Self-pay

## 2023-04-15 ENCOUNTER — Ambulatory Visit (INDEPENDENT_AMBULATORY_CARE_PROVIDER_SITE_OTHER): Payer: 59 | Admitting: Otolaryngology

## 2023-04-15 VITALS — BP 125/87 | HR 83 | Ht 65.0 in | Wt 150.0 lb

## 2023-04-15 DIAGNOSIS — H9012 Conductive hearing loss, unilateral, left ear, with unrestricted hearing on the contralateral side: Secondary | ICD-10-CM | POA: Diagnosis not present

## 2023-04-15 DIAGNOSIS — Z09 Encounter for follow-up examination after completed treatment for conditions other than malignant neoplasm: Secondary | ICD-10-CM

## 2023-04-15 DIAGNOSIS — Z9629 Presence of other otological and audiological implants: Secondary | ICD-10-CM

## 2023-04-15 DIAGNOSIS — H6122 Impacted cerumen, left ear: Secondary | ICD-10-CM | POA: Diagnosis not present

## 2023-04-15 DIAGNOSIS — H903 Sensorineural hearing loss, bilateral: Secondary | ICD-10-CM

## 2023-04-15 DIAGNOSIS — J310 Chronic rhinitis: Secondary | ICD-10-CM | POA: Diagnosis not present

## 2023-04-15 DIAGNOSIS — Z8669 Personal history of other diseases of the nervous system and sense organs: Secondary | ICD-10-CM

## 2023-04-15 DIAGNOSIS — R0981 Nasal congestion: Secondary | ICD-10-CM | POA: Diagnosis not present

## 2023-04-15 DIAGNOSIS — H6982 Other specified disorders of Eustachian tube, left ear: Secondary | ICD-10-CM

## 2023-04-17 DIAGNOSIS — H6122 Impacted cerumen, left ear: Secondary | ICD-10-CM | POA: Insufficient documentation

## 2023-04-17 DIAGNOSIS — H903 Sensorineural hearing loss, bilateral: Secondary | ICD-10-CM | POA: Insufficient documentation

## 2023-04-17 DIAGNOSIS — J310 Chronic rhinitis: Secondary | ICD-10-CM | POA: Insufficient documentation

## 2023-04-17 DIAGNOSIS — H6982 Other specified disorders of Eustachian tube, left ear: Secondary | ICD-10-CM | POA: Insufficient documentation

## 2023-04-17 NOTE — Progress Notes (Signed)
 Patient ID: Karla Ferguson, female   DOB: February 27, 1960, 63 y.o.   MRN: [id number]  Follow-up: Chronic nasal obstruction, recurrent sinusitis, left ear eustachian tube dysfunction, left middle ear effusion  HPI: The patient is a 63 year old female who returns today for follow-up evaluation.  The patient was previously seen for chronic nasal obstruction, recurrent sinusitis, left ear eustachian tube dysfunction, and left middle ear effusion.  She was treated with septoplasty, bilateral turbinate reduction, and left myringotomy and tube placement in February 2024.  The patient returns today reporting improvement in her nasal breathing.  However, she still has clogging sensation and hearing difficulty in her left ear.  She denies any recent sinusitis or ear infection.  Currently she denies any otalgia, otorrhea, change in her hearing, facial pain, or fever.  Exam: General: Communicates without difficulty, well nourished, no acute distress. Head: Normocephalic, no evidence injury, no tenderness, facial buttresses intact without stepoff. Face/sinus: No tenderness to palpation and percussion. Facial movement is normal and symmetric. Eyes: PERRL, EOMI. No scleral icterus, conjunctivae clear. Neuro: CN II exam reveals vision grossly intact.  No nystagmus at any point of gaze. Ears: Auricles well formed without lesions.  Left ear cerumen impaction.  The left tube has extruded into the ear canal and is encased within the cerumen.  Nose: External evaluation reveals normal support and skin without lesions.  Dorsum is intact.  Anterior rhinoscopy reveals congested mucosa over anterior aspect of inferior turbinates and intact septum.  No purulence noted. Oral:  Oral cavity and oropharynx are intact, symmetric, without erythema or edema.  Mucosa is moist without lesions. Neck: Full range of motion without pain.  There is no significant lymphadenopathy.  No masses palpable.  Thyroid bed within normal limits to palpation.   Parotid glands and submandibular glands equal bilaterally without mass.  Trachea is midline. Neuro:  CN 2-12 grossly intact. Gait wide-based. Vestibular: Dix-Hallpike is negative. Vestibular: There is no nystagmus with pneumatic pressure on either tympanic membrane or Valsalva. The cerebellar examination is unremarkable.   Procedure: Left ear cerumen disimpaction and tube removal Anesthesia: None Description: Under the operating microscope, the cerumen and tube are carefully removed with a combination of cerumen currette, alligator forceps, and suction catheters.  After the cerumen is removed, the tympanic membrane is noted to be normal.  No mass, erythema, or lesions. The patient tolerated the procedure well.    Assessment: 1.  Chronic rhinitis with moderate nasal mucosal congestion.  The severity of the nasal congestion has decreased. 2.  Her septum and turbinates are well-healed.  Her nasal passageways are patent. 3.  Left ear cerumen impaction.  The left ear tube has extruded into the ear canal and is encased within the cerumen.  The left tympanic membrane has healed. 4.  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  Plan: 1.  Otomicroscopy with left ear cerumen and tube removal. 2.  The physical exam findings are reviewed with the patient. 3.  Continue with Flonase nasal spray 2 sprays each nostril daily. 4.  Valsalva exercise multiple times a day. 5.  The patient will return for reevaluation in 6 months.

## 2023-08-08 ENCOUNTER — Other Ambulatory Visit (HOSPITAL_COMMUNITY): Payer: Self-pay | Admitting: Obstetrics and Gynecology

## 2023-08-08 DIAGNOSIS — Z1231 Encounter for screening mammogram for malignant neoplasm of breast: Secondary | ICD-10-CM

## 2023-09-05 ENCOUNTER — Ambulatory Visit (HOSPITAL_COMMUNITY)
Admission: RE | Admit: 2023-09-05 | Discharge: 2023-09-05 | Disposition: A | Discharge status: Home / Self Care | Source: Ambulatory Visit | Attending: Obstetrics and Gynecology | Admitting: Obstetrics and Gynecology

## 2023-09-05 DIAGNOSIS — Z1231 Encounter for screening mammogram for malignant neoplasm of breast: Secondary | ICD-10-CM | POA: Diagnosis present

## 2023-10-21 ENCOUNTER — Ambulatory Visit (INDEPENDENT_AMBULATORY_CARE_PROVIDER_SITE_OTHER): Payer: 59 | Admitting: Otolaryngology
